# Patient Record
Sex: Female | Born: 1965 | Race: White | Hispanic: No | Marital: Single | Smoking: Current every day smoker
Health system: Southern US, Community
[De-identification: ages and names within clinical notes are randomized; demographics above are authoritative.]

## PROBLEM LIST (undated history)

## (undated) DIAGNOSIS — C4490 Unspecified malignant neoplasm of skin, unspecified: Secondary | ICD-10-CM

## (undated) DIAGNOSIS — O24419 Gestational diabetes mellitus in pregnancy, unspecified control: Secondary | ICD-10-CM

## (undated) DIAGNOSIS — F329 Major depressive disorder, single episode, unspecified: Secondary | ICD-10-CM

## (undated) DIAGNOSIS — F32A Depression, unspecified: Secondary | ICD-10-CM

## (undated) DIAGNOSIS — F419 Anxiety disorder, unspecified: Secondary | ICD-10-CM

## (undated) HISTORY — DX: Major depressive disorder, single episode, unspecified: F32.9

## (undated) HISTORY — DX: Gestational diabetes mellitus in pregnancy, unspecified control: O24.419

## (undated) HISTORY — DX: Depression, unspecified: F32.A

## (undated) HISTORY — DX: Unspecified malignant neoplasm of skin, unspecified: C44.90

## (undated) HISTORY — DX: Anxiety disorder, unspecified: F41.9

## (undated) HISTORY — PX: SKIN CANCER EXCISION: SHX779

## (undated) HISTORY — PX: TUBAL LIGATION: SHX77

---
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
## 1998-05-03 DIAGNOSIS — C4490 Unspecified malignant neoplasm of skin, unspecified: Secondary | ICD-10-CM

## 1998-05-03 HISTORY — DX: Unspecified malignant neoplasm of skin, unspecified: C44.90

## 2005-12-14 ENCOUNTER — Ambulatory Visit: Payer: Self-pay | Admitting: Family Medicine

## 2005-12-18 ENCOUNTER — Emergency Department: Payer: Self-pay | Admitting: Emergency Medicine

## 2005-12-27 ENCOUNTER — Observation Stay: Payer: Self-pay | Admitting: Unknown Physician Specialty

## 2006-01-01 ENCOUNTER — Emergency Department: Payer: Self-pay | Admitting: Emergency Medicine

## 2006-01-01 ENCOUNTER — Ambulatory Visit: Payer: Self-pay | Admitting: Family Medicine

## 2006-01-31 ENCOUNTER — Ambulatory Visit: Payer: Self-pay | Admitting: Family Medicine

## 2006-02-23 ENCOUNTER — Inpatient Hospital Stay: Payer: Self-pay

## 2006-03-03 ENCOUNTER — Ambulatory Visit: Payer: Self-pay | Admitting: Family Medicine

## 2006-04-02 ENCOUNTER — Ambulatory Visit: Payer: Self-pay | Admitting: Family Medicine

## 2006-05-03 ENCOUNTER — Ambulatory Visit: Payer: Self-pay | Admitting: Family Medicine

## 2012-09-07 ENCOUNTER — Ambulatory Visit: Payer: Self-pay | Admitting: Nurse Practitioner

## 2013-05-02 ENCOUNTER — Emergency Department: Payer: Self-pay | Admitting: Emergency Medicine

## 2014-11-10 IMAGING — CR DG ELBOW COMPLETE 3+V*L*
1 series · 4 of 4 positions shown · non-contrast
Comparison: None.

CLINICAL DATA: Pain for 1 month, no known injury

EXAM:
LEFT ELBOW - COMPLETE 3+ VIEW

[Series 1: x elbow lat left · 0.14mm/px · 4 of 4 slices shown]
[im 1/4]
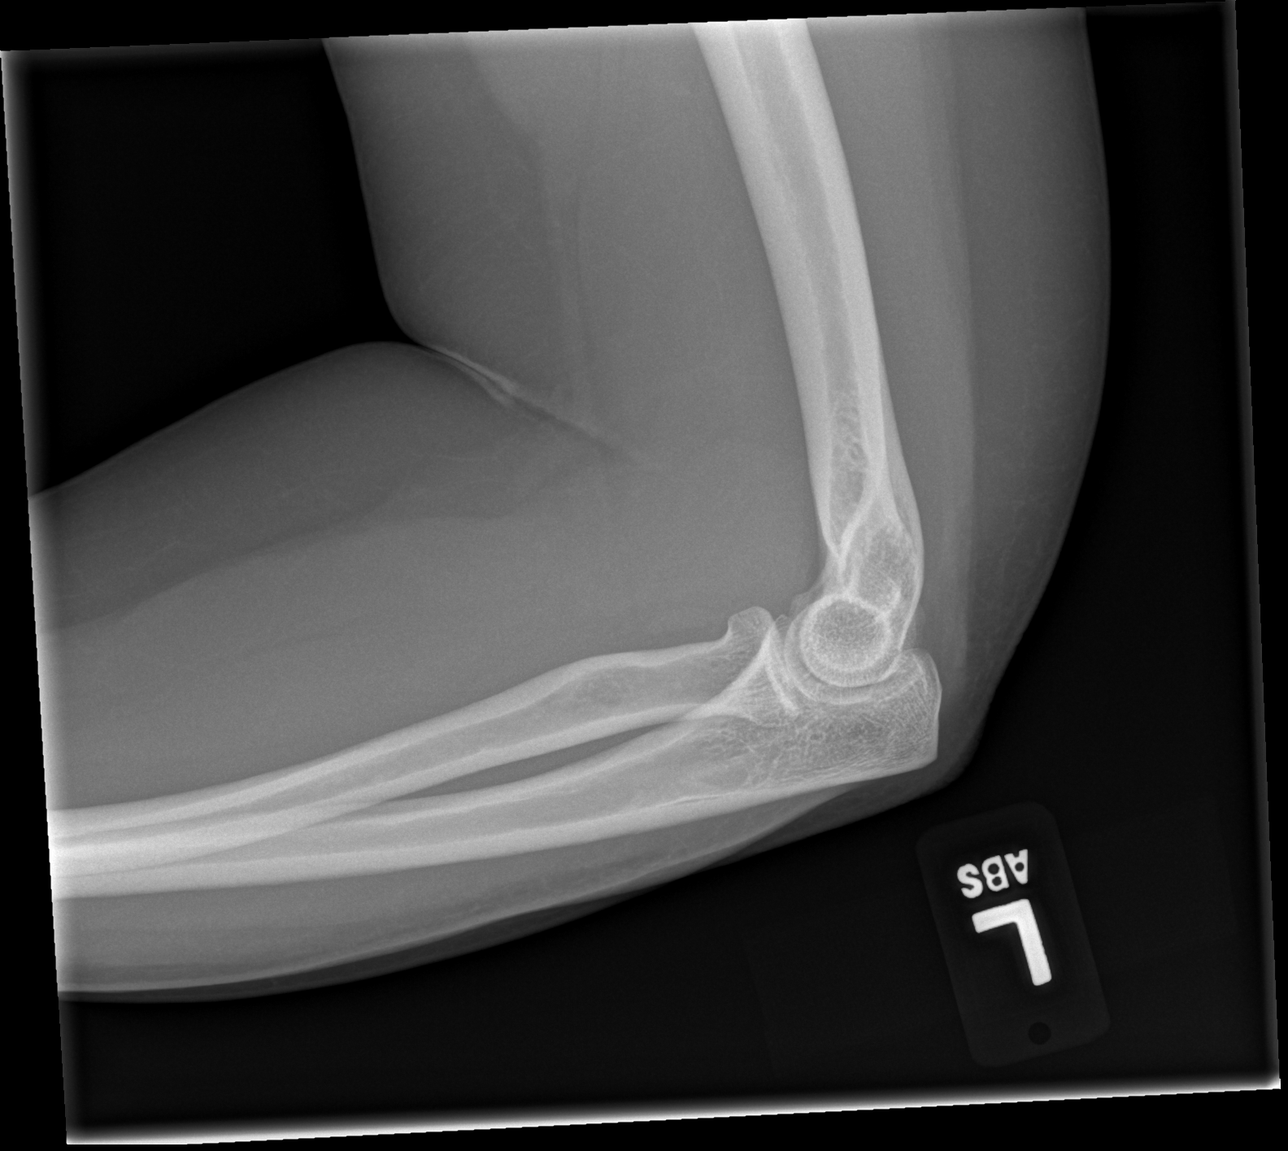
[im 2/4]
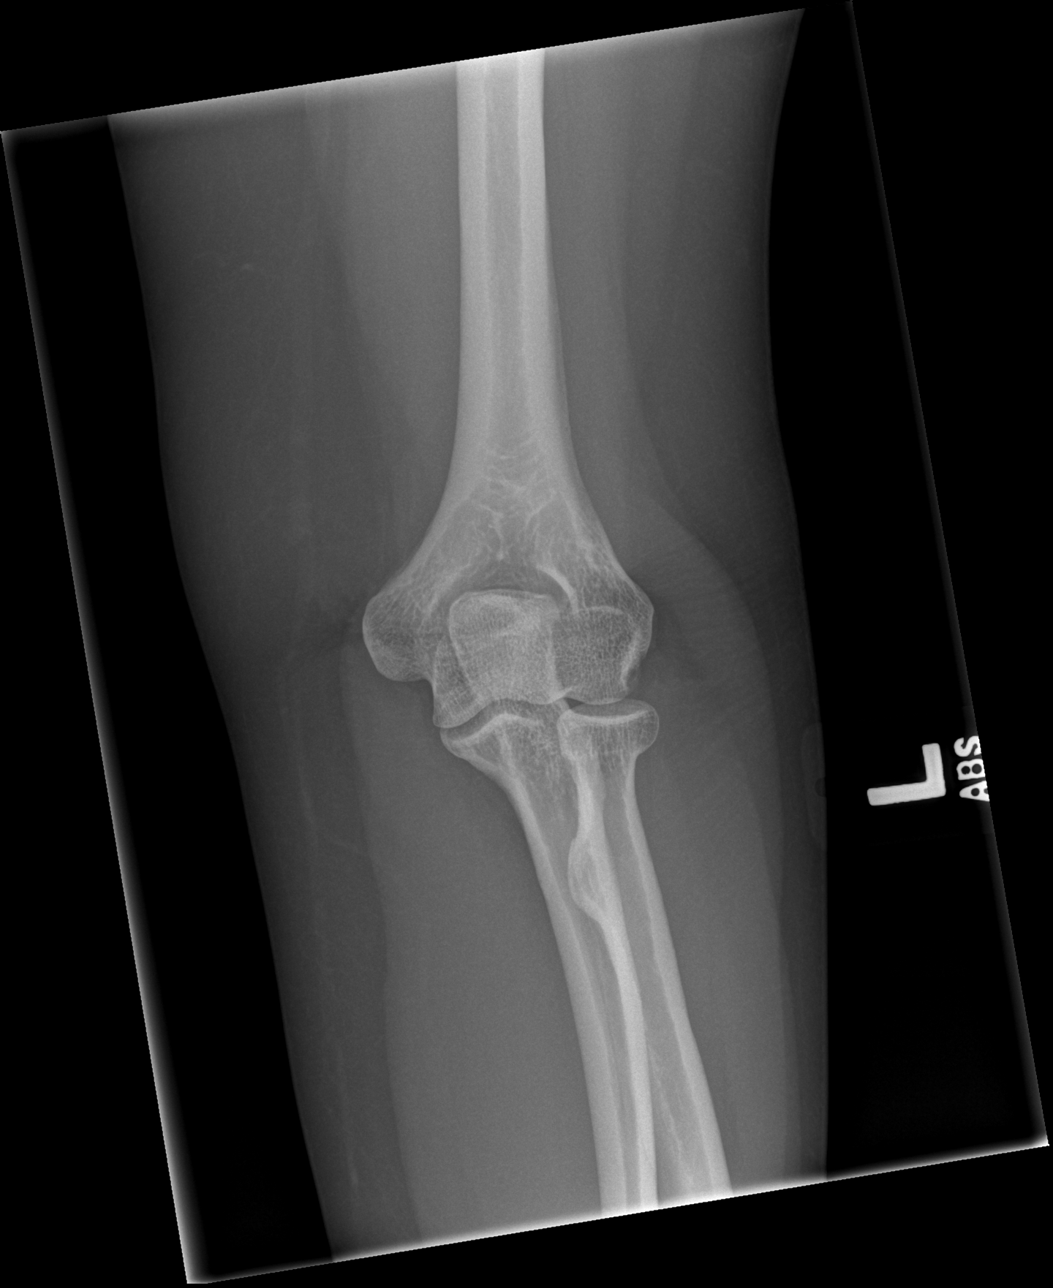
[im 3/4]
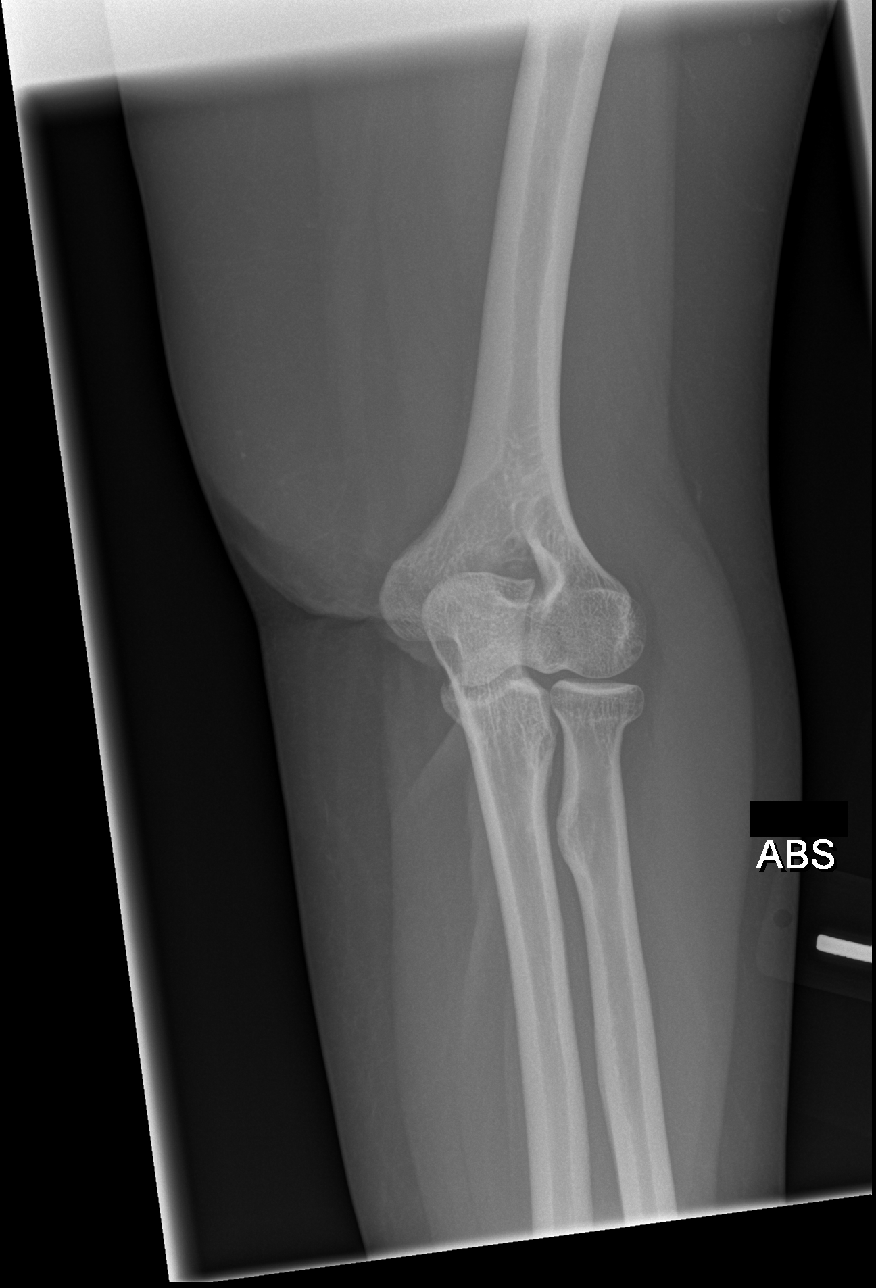
[im 4/4]
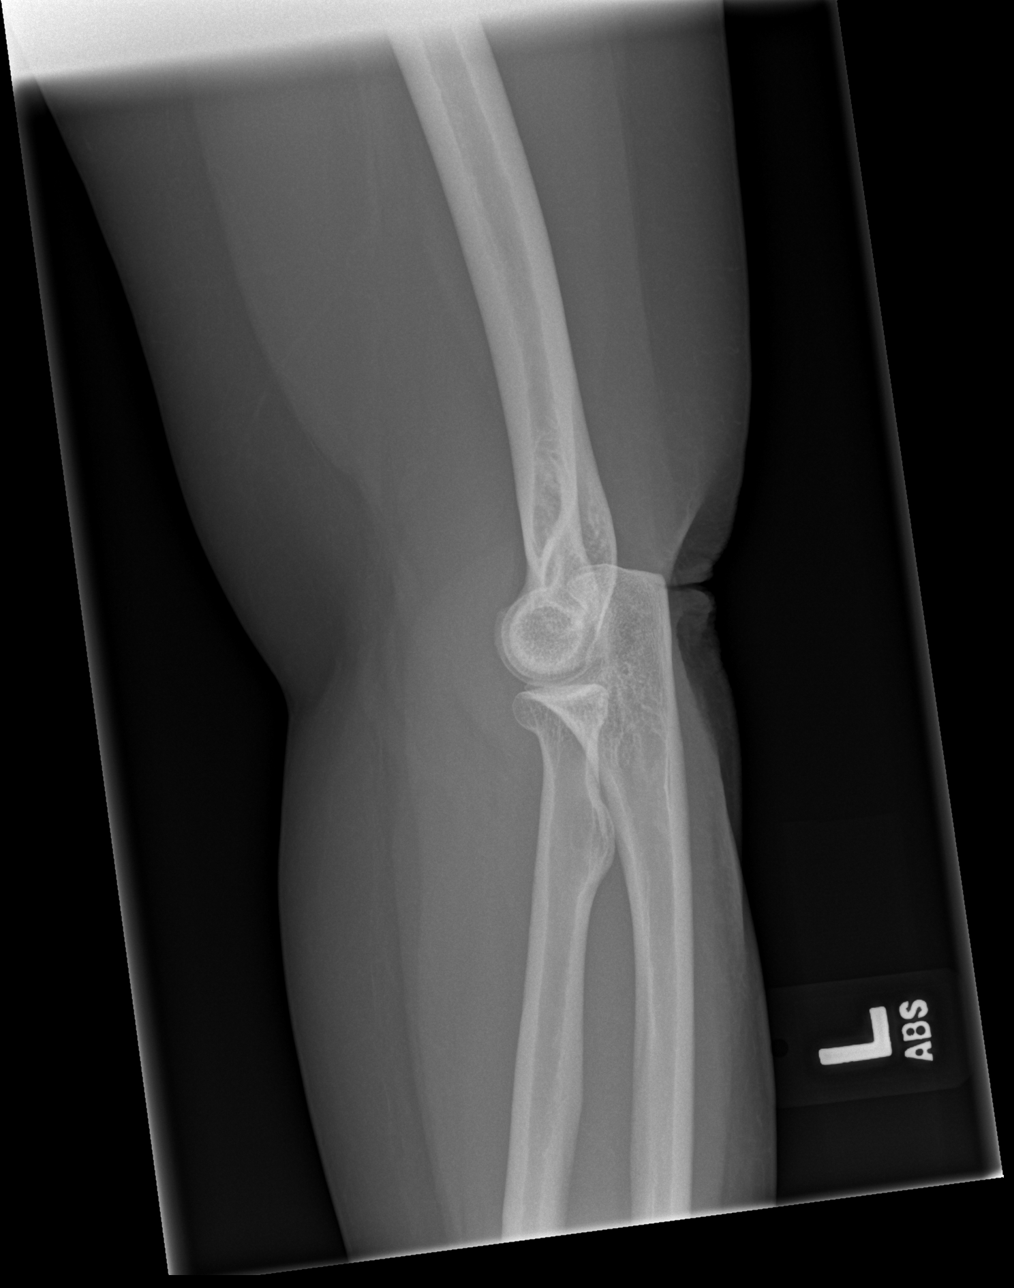

[4 of 4 positions shown; findings below may reference images not displayed]

FINDINGS: There is no evidence of fracture, dislocation, or joint effusion.
There is no evidence of arthropathy or other focal bone abnormality.
Soft tissues are unremarkable.
IMPRESSION: Negative.

## 2015-03-20 ENCOUNTER — Ambulatory Visit (INDEPENDENT_AMBULATORY_CARE_PROVIDER_SITE_OTHER): Payer: Medicaid Other | Admitting: Family Medicine

## 2015-03-20 ENCOUNTER — Encounter: Payer: Self-pay | Admitting: Family Medicine

## 2015-03-20 VITALS — BP 122/84 | HR 58 | Temp 98.4°F | Ht 65.2 in | Wt 247.0 lb

## 2015-03-20 DIAGNOSIS — Z113 Encounter for screening for infections with a predominantly sexual mode of transmission: Secondary | ICD-10-CM | POA: Diagnosis not present

## 2015-03-20 DIAGNOSIS — Z720 Tobacco use: Secondary | ICD-10-CM | POA: Diagnosis not present

## 2015-03-20 DIAGNOSIS — Z85820 Personal history of malignant melanoma of skin: Secondary | ICD-10-CM | POA: Diagnosis not present

## 2015-03-20 DIAGNOSIS — E669 Obesity, unspecified: Secondary | ICD-10-CM | POA: Insufficient documentation

## 2015-03-20 DIAGNOSIS — Z23 Encounter for immunization: Secondary | ICD-10-CM

## 2015-03-20 DIAGNOSIS — Z13220 Encounter for screening for lipoid disorders: Secondary | ICD-10-CM | POA: Diagnosis not present

## 2015-03-20 DIAGNOSIS — L989 Disorder of the skin and subcutaneous tissue, unspecified: Secondary | ICD-10-CM

## 2015-03-20 DIAGNOSIS — R238 Other skin changes: Secondary | ICD-10-CM | POA: Insufficient documentation

## 2015-03-20 LAB — UA/M W/RFLX CULTURE, ROUTINE
Bilirubin, UA: NEGATIVE
GLUCOSE, UA: NEGATIVE
Ketones, UA: NEGATIVE
LEUKOCYTES UA: NEGATIVE
Nitrite, UA: NEGATIVE
PH UA: 5 (ref 5.0–7.5)
PROTEIN UA: NEGATIVE
Specific Gravity, UA: 1.01 (ref 1.005–1.030)
UUROB: 0.2 mg/dL (ref 0.2–1.0)

## 2015-03-20 LAB — MICROSCOPIC EXAMINATION: WBC, UA: NONE SEEN /hpf (ref 0–?)

## 2015-03-20 MED ORDER — VALACYCLOVIR HCL 1 G PO TABS: 1000.0000 mg | ORAL_TABLET | Freq: Two times a day (BID) | ORAL | Status: DC

## 2015-03-20 NOTE — Assessment & Plan Note (Signed)
Will get her back into see dermatology ASAP. Referral generated today. She will call and discuss with them about getting in as she has a balance and will be scheduled ASAP.

## 2015-03-20 NOTE — Assessment & Plan Note (Signed)
With vessicles, could be a HSV outbreak, could also be irritation from chronic moisture. Will treat with valacyclovir and advised her to keep area dry and let air as much as she can. Will use vasaline on it for comfort. Will await labs and check back in in 2 weeks to make sure it's better.

## 2015-03-20 NOTE — Assessment & Plan Note (Signed)
Encouraged patient to quit smoking. Will check UA today.

## 2015-03-20 NOTE — Progress Notes (Signed)
BP 122/84 mmHg  Pulse 58  Temp(Src) 98.4 F (36.9 C)  Ht 5' 5.2" (1.656 m)  Wt 247 lb (112.038 kg)  BMI 40.85 kg/m2  SpO2 100%  LMP 03/19/2015 (Exact Date)   Subjective:    Patient ID: Courtney Buchanan, female    DOB: 05-Oct-1965, 49 y.o.   MRN: [id number]  HPI: Courtney Buchanan is a 49 y.o. female who presents today to establish care. Did a physical about 2 years ago. Can't remember where she went. Does have a history of melanoma, but has not seen a dermatologist in 2 years either because of insurance. She had her excision done in 2000, and states that everything has been good since then.   Chief Complaint  Patient presents with  . skin lesion    Patient states that she has a sore on her lower right abdomen near the vagina, she states that she is unsure if it is from wearing pads.   SKIN LESION- had a blister on her vagina for the past couple of days. It's been incredibly painful. It seems like it popped on Monday night, but it's still there. She has been wearing a pad more often as she's had some issues with incontinence and isn't sure if it's just irritation. It's not gone, has been hurting a lot Duration: started on Sunday, popped on Monday night Location: on the R side near her vagina Painful: yes Itching: no Onset: sudden Context: smaller Associated signs and symptoms: none History of skin cancer: yes History of precancerous skin lesions: yes Family history of skin cancer: no  Relevant past medical, surgical, family and social history reviewed and updated as indicated. Interim medical history since our last visit reviewed. Allergies and medications reviewed and updated.  Review of Systems  Constitutional: Negative.   Respiratory: Negative.   Cardiovascular: Negative.   Gastrointestinal: Negative.   Genitourinary: Positive for genital sores. Negative for dysuria, urgency, frequency, hematuria, flank pain, decreased urine volume, vaginal bleeding, vaginal discharge,  enuresis, difficulty urinating, vaginal pain, menstrual problem, pelvic pain and dyspareunia.  Skin: Positive for rash and wound. Negative for color change and pallor.  Psychiatric/Behavioral: Negative.     Per HPI unless specifically indicated above     Objective:    BP 122/84 mmHg  Pulse 58  Temp(Src) 98.4 F (36.9 C)  Ht 5' 5.2" (1.656 m)  Wt 247 lb (112.038 kg)  BMI 40.85 kg/m2  SpO2 100%  LMP 03/19/2015 (Exact Date)  Wt Readings from Last 3 Encounters:  03/20/15 247 lb (112.038 kg)    Physical Exam  Constitutional: She is oriented to person, place, and time. She appears well-developed and well-nourished. No distress.  HENT:  Head: Normocephalic and atraumatic.  Right Ear: Hearing normal.  Left Ear: Hearing normal.  Nose: Nose normal.  Eyes: Conjunctivae and lids are normal. Right eye exhibits no discharge. Left eye exhibits no discharge. No scleral icterus.  Cardiovascular: Normal rate, regular rhythm, normal heart sounds and intact distal pulses.  Exam reveals no gallop and no friction rub.   No murmur heard. Pulmonary/Chest: Effort normal and breath sounds normal. No respiratory distress. She has no wheezes. She has no rales.  Abdominal: Soft. Bowel sounds are normal. She exhibits no distension and no mass. There is no tenderness. There is no rebound and no guarding.  Genitourinary:    No labial fusion. There is no rash, tenderness, lesion or injury on the right labia. There is no rash, tenderness, lesion or injury on the left labia. No  erythema, tenderness or bleeding in the vagina. No foreign body around the vagina. No signs of injury around the vagina. No vaginal discharge found.  1 inch erythematous base with vessicles on R mons pubis about 2inches above labia  Musculoskeletal: Normal range of motion.  Neurological: She is alert and oriented to person, place, and time.  Skin: Skin is warm, dry and intact. No rash noted. No erythema. No pallor.  Psychiatric: She  has a normal mood and affect. Her speech is normal and behavior is normal. Judgment and thought content normal. Cognition and memory are normal.  Nursing note and vitals reviewed.  Care Everywhere reviewed, but not much in it.  Will obtain records from [facility] where she was seen 2 years ago.   Results for orders placed or performed in visit on 03/20/15  Microscopic Examination  Result Value Ref Range   WBC, UA None seen 0 -  5 /hpf   RBC, UA 0-2 0 -  2 /hpf   Epithelial Cells (non renal) 0-10 0 - 10 /hpf   Bacteria, UA Few None seen/Few  UA/M w/rflx Culture, Routine  Result Value Ref Range   Specific Gravity, UA 1.010 1.005 - 1.030   pH, UA 5.0 5.0 - 7.5   Color, UA Yellow Yellow   Appearance Ur Clear Clear   Leukocytes, UA Negative Negative   Protein, UA Negative Negative/Trace   Glucose, UA Negative Negative   Ketones, UA Negative Negative   RBC, UA 1+ (A) Negative   Bilirubin, UA Negative Negative   Urobilinogen, Ur 0.2 0.2 - 1.0 mg/dL   Nitrite, UA Negative Negative   Microscopic Examination See below:       Assessment & Plan:   Problem List Items Addressed This Visit      Musculoskeletal and Integument   Skin irritation - Primary    With vessicles, could be a HSV outbreak, could also be irritation from chronic moisture. Will treat with valacyclovir and advised her to keep area dry and let air as much as she can. Will use vasaline on it for comfort. Will await labs and check back in in 2 weeks to make sure it's better.         Other   History of melanoma    Will get her back into see dermatology ASAP. Referral generated today. She will call and discuss with them about getting in as she has a balance and will be scheduled ASAP.       Relevant Orders   Ambulatory referral to Dermatology   CBC with Differential/Platelet   Comprehensive metabolic panel   Tobacco abuse    Encouraged patient to quit smoking. Will check UA today.      Relevant Orders    UA/M w/rflx Culture, Routine (Completed)   Obesity   Relevant Orders   TSH    Other Visit Diagnoses    Immunization due        Tdap due and given today.    Relevant Orders    Tdap vaccine greater than or equal to 7yo IM (Completed)    Routine screening for STI (sexually transmitted infection)        Labs drawn today. Not concerned, but states it's better to be safe than sorry.    Relevant Orders    HIV antibody    Hepatitis, Acute    GC/Chlamydia Probe Amp    RPR    HSV(herpes simplex vrs) 1+2 ab-IgG    Screening for cholesterol level  Labs drawn today. Await results.     Relevant Orders    Lipid Panel w/o Chol/HDL Ratio        Follow up plan: Return 2-3 weeks, for PE and follow up skin issues, records request from [place].

## 2015-03-21 ENCOUNTER — Telehealth: Payer: Self-pay | Admitting: Family Medicine

## 2015-03-21 DIAGNOSIS — E785 Hyperlipidemia, unspecified: Secondary | ICD-10-CM | POA: Insufficient documentation

## 2015-03-21 LAB — CBC WITH DIFFERENTIAL/PLATELET
BASOS: 1 %
Basophils Absolute: 0 10*3/uL (ref 0.0–0.2)
EOS (ABSOLUTE): 0.2 10*3/uL (ref 0.0–0.4)
Eos: 3 %
HEMOGLOBIN: 12.2 g/dL (ref 11.1–15.9)
Hematocrit: 37.6 % (ref 34.0–46.6)
IMMATURE GRANS (ABS): 0 10*3/uL (ref 0.0–0.1)
Immature Granulocytes: 0 %
LYMPHS ABS: 2.2 10*3/uL (ref 0.7–3.1)
Lymphs: 34 %
MCH: 29.8 pg (ref 26.6–33.0)
MCHC: 32.4 g/dL (ref 31.5–35.7)
MCV: 92 fL (ref 79–97)
MONOCYTES: 7 %
Monocytes Absolute: 0.4 10*3/uL (ref 0.1–0.9)
NEUTROS ABS: 3.6 10*3/uL (ref 1.4–7.0)
Neutrophils: 55 %
Platelets: 284 10*3/uL (ref 150–379)
RBC: 4.1 x10E6/uL (ref 3.77–5.28)
RDW: 15.4 % (ref 12.3–15.4)
WBC: 6.5 10*3/uL (ref 3.4–10.8)

## 2015-03-21 LAB — TSH: TSH: 0.909 u[IU]/mL (ref 0.450–4.500)

## 2015-03-21 LAB — COMPREHENSIVE METABOLIC PANEL
ALK PHOS: 49 IU/L (ref 39–117)
ALT: 10 IU/L (ref 0–32)
AST: 13 IU/L (ref 0–40)
Albumin/Globulin Ratio: 1.6 (ref 1.1–2.5)
Albumin: 4 g/dL (ref 3.5–5.5)
BUN/Creatinine Ratio: 15 (ref 9–23)
BUN: 11 mg/dL (ref 6–24)
Bilirubin Total: <0.2 mg/dL (ref 0.0–1.2)
CO2: 23 mmol/L (ref 18–29)
CREATININE: 0.72 mg/dL (ref 0.57–1.00)
Calcium: 9 mg/dL (ref 8.7–10.2)
Chloride: 102 mmol/L (ref 97–106)
GFR calc Af Amer: 115 mL/min/{1.73_m2} (ref >59)
GFR calc non Af Amer: 100 mL/min/{1.73_m2} (ref >59)
GLUCOSE: 89 mg/dL (ref 65–99)
Globulin, Total: 2.5 g/dL (ref 1.5–4.5)
Potassium: 4.9 mmol/L (ref 3.5–5.2)
Sodium: 140 mmol/L (ref 136–144)
Total Protein: 6.5 g/dL (ref 6.0–8.5)

## 2015-03-21 LAB — HEPATITIS PANEL, ACUTE
HEP A IGM: NEGATIVE
HEP B C IGM: NEGATIVE
HEP B S AG: NEGATIVE

## 2015-03-21 LAB — LIPID PANEL W/O CHOL/HDL RATIO
Cholesterol, Total: 202 mg/dL — ABNORMAL HIGH (ref 100–199)
HDL: 51 mg/dL (ref >39)
LDL CALC: 136 mg/dL — AB (ref 0–99)
Triglycerides: 76 mg/dL (ref 0–149)
VLDL CHOLESTEROL CAL: 15 mg/dL (ref 5–40)

## 2015-03-21 LAB — HIV ANTIBODY (ROUTINE TESTING W REFLEX): HIV Screen 4th Generation wRfx: NONREACTIVE

## 2015-03-21 LAB — HSV(HERPES SIMPLEX VRS) I + II AB-IGG: HSV 2 GLYCOPROTEIN G AB, IGG: 8.26 {index} — AB (ref 0.00–0.90)

## 2015-03-21 LAB — RPR: RPR: NONREACTIVE

## 2015-03-21 NOTE — Telephone Encounter (Signed)
Called and gave patient the results. 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 Continue valtrex and monitor. Recheck at scheduled appointment in 2 weeks.

## 2015-03-22 LAB — GC/CHLAMYDIA PROBE AMP
CHLAMYDIA, DNA PROBE: NEGATIVE
Neisseria gonorrhoeae by PCR: NEGATIVE

## 2015-03-24 ENCOUNTER — Encounter: Payer: Self-pay | Admitting: Family Medicine

## 2015-04-10 ENCOUNTER — Encounter: Payer: Self-pay | Admitting: Family Medicine

## 2015-04-10 ENCOUNTER — Ambulatory Visit (INDEPENDENT_AMBULATORY_CARE_PROVIDER_SITE_OTHER): Payer: Medicaid Other | Admitting: Family Medicine

## 2015-04-10 ENCOUNTER — Encounter: Payer: Self-pay | Admitting: *Deleted

## 2015-04-10 ENCOUNTER — Telehealth: Payer: Self-pay

## 2015-04-10 VITALS — BP 130/80 | HR 74 | Temp 98.5°F | Ht 65.0 in | Wt 246.0 lb

## 2015-04-10 DIAGNOSIS — F332 Major depressive disorder, recurrent severe without psychotic features: Secondary | ICD-10-CM

## 2015-04-10 DIAGNOSIS — B009 Herpesviral infection, unspecified: Secondary | ICD-10-CM | POA: Insufficient documentation

## 2015-04-10 DIAGNOSIS — Z1239 Encounter for other screening for malignant neoplasm of breast: Secondary | ICD-10-CM

## 2015-04-10 DIAGNOSIS — Z85820 Personal history of malignant melanoma of skin: Secondary | ICD-10-CM

## 2015-04-10 DIAGNOSIS — Z0000 Encounter for general adult medical examination without abnormal findings: Secondary | ICD-10-CM

## 2015-04-10 DIAGNOSIS — Z1159 Encounter for screening for other viral diseases: Secondary | ICD-10-CM

## 2015-04-10 DIAGNOSIS — K649 Unspecified hemorrhoids: Secondary | ICD-10-CM | POA: Insufficient documentation

## 2015-04-10 DIAGNOSIS — N393 Stress incontinence (female) (male): Secondary | ICD-10-CM | POA: Insufficient documentation

## 2015-04-10 MED ORDER — FLUOXETINE HCL 20 MG PO TABS: ORAL_TABLET | ORAL | Status: DC

## 2015-04-10 MED ORDER — FLUOXETINE HCL 20 MG PO CAPS: 20.0000 mg | ORAL_CAPSULE | Freq: Every day | ORAL | Status: DC

## 2015-04-10 NOTE — Telephone Encounter (Signed)
Rx sent to her pharmacy 

## 2015-04-10 NOTE — Telephone Encounter (Signed)
Please send a new script for BRAND name fluoxetine 20mg  [facility] [place]

## 2015-04-10 NOTE — Assessment & Plan Note (Signed)
To see dermatology ASAP. Just needs to pay off a bill.

## 2015-04-10 NOTE — Patient Instructions (Signed)
Health Maintenance, Female Adopting a healthy lifestyle and getting preventive care can go a long way to promote health and wellness. Talk with your health care provider about what schedule of regular examinations is right for you. This is a good chance for you to check in with your provider about disease prevention and staying healthy. In between checkups, there are plenty of things you can do on your own. Experts have done a lot of research about which lifestyle changes and preventive measures are most likely to keep you healthy. Ask your health care provider for more information. WEIGHT AND DIET  Eat a healthy diet  Be sure to include plenty of vegetables, fruits, low-fat dairy products, and lean protein.  Do not eat a lot of foods high in solid fats, added sugars, or salt.  Get regular exercise. This is one of the most important things you can do for your health.  Most adults should exercise for at least 150 minutes each week. The exercise should increase your heart rate and make you sweat (moderate-intensity exercise).  Most adults should also do strengthening exercises at least twice a week. This is in addition to the moderate-intensity exercise.  Maintain a healthy weight  Body mass index (BMI) is a measurement that can be used to identify possible weight problems. It estimates body fat based on height and weight. Your health care provider can help determine your BMI and help you achieve or maintain a healthy weight.  For females 20 years of age and older:   A BMI below 18.5 is considered underweight.  A BMI of 18.5 to 24.9 is normal.  A BMI of 25 to 29.9 is considered overweight.  A BMI of 30 and above is considered obese.  Watch levels of cholesterol and blood lipids  You should start having your blood tested for lipids and cholesterol at 47 years of age, then have this test every 5 years.  You may need to have your cholesterol levels checked more often if:  Your lipid  or cholesterol levels are high.  You are older than 47 years of age.  You are at high risk for heart disease.  CANCER SCREENING   Lung Cancer  Lung cancer screening is recommended for adults 55-80 years old who are at high risk for lung cancer because of a history of smoking.  A yearly low-dose CT scan of the lungs is recommended for people who:  Currently smoke.  Have quit within the past 15 years.  Have at least a 30-pack-year history of smoking. A pack year is smoking an average of one pack of cigarettes a day for 1 year.  Yearly screening should continue until it has been 15 years since you quit.  Yearly screening should stop if you develop a health problem that would prevent you from having lung cancer treatment.  Breast Cancer  Practice breast self-awareness. This means understanding how your breasts normally appear and feel.  It also means doing regular breast self-exams. Let your health care provider know about any changes, no matter how small.  If you are in your 20s or 30s, you should have a clinical breast exam (CBE) by a health care provider every 1-3 years as part of a regular health exam.  If you are 40 or older, have a CBE every year. Also consider having a breast X-ray (mammogram) every year.  If you have a family history of breast cancer, talk to your health care provider about genetic screening.  If you   are at high risk for breast cancer, talk to your health care provider about having an MRI and a mammogram every year.  Breast cancer gene (BRCA) assessment is recommended for women who have family members with BRCA-related cancers. BRCA-related cancers include:  Breast.  Ovarian.  Tubal.  Peritoneal cancers.  Results of the assessment will determine the need for genetic counseling and BRCA1 and BRCA2 testing. Cervical Cancer Your health care provider may recommend that you be screened regularly for cancer of the pelvic organs (ovaries, uterus, and  vagina). This screening involves a pelvic examination, including checking for microscopic changes to the surface of your cervix (Pap test). You may be encouraged to have this screening done every 3 years, beginning at age 21.  For women ages 30-65, health care providers may recommend pelvic exams and Pap testing every 3 years, or they may recommend the Pap and pelvic exam, combined with testing for human papilloma virus (HPV), every 5 years. Some types of HPV increase your risk of cervical cancer. Testing for HPV may also be done on women of any age with unclear Pap test results.  Other health care providers may not recommend any screening for nonpregnant women who are considered low risk for pelvic cancer and who do not have symptoms. Ask your health care provider if a screening pelvic exam is right for you.  If you have had past treatment for cervical cancer or a condition that could lead to cancer, you need Pap tests and screening for cancer for at least 20 years after your treatment. If Pap tests have been discontinued, your risk factors (such as having a new sexual partner) need to be reassessed to determine if screening should resume. Some women have medical problems that increase the chance of getting cervical cancer. In these cases, your health care provider may recommend more frequent screening and Pap tests. Colorectal Cancer  This type of cancer can be detected and often prevented.  Routine colorectal cancer screening usually begins at 47 years of age and continues through 47 years of age.  Your health care provider may recommend screening at an earlier age if you have risk factors for colon cancer.  Your health care provider may also recommend using home test kits to check for hidden blood in the stool.  A small camera at the end of a tube can be used to examine your colon directly (sigmoidoscopy or colonoscopy). This is done to check for the earliest forms of colorectal  cancer.  Routine screening usually begins at age 50.  Direct examination of the colon should be repeated every 5-10 years through 47 years of age. However, you may need to be screened more often if early forms of precancerous polyps or small growths are found. Skin Cancer  Check your skin from head to toe regularly.  Tell your health care provider about any new moles or changes in moles, especially if there is a change in a mole's shape or color.  Also tell your health care provider if you have a mole that is larger than the size of a pencil eraser.  Always use sunscreen. Apply sunscreen liberally and repeatedly throughout the day.  Protect yourself by wearing long sleeves, pants, a wide-brimmed hat, and sunglasses whenever you are outside. HEART DISEASE, DIABETES, AND HIGH BLOOD PRESSURE   High blood pressure causes heart disease and increases the risk of stroke. High blood pressure is more likely to develop in:  People who have blood pressure in the high end   of the normal range (130-139/85-89 mm Hg).  People who are overweight or obese.  People who are African American.  If you are 38-23 years of age, have your blood pressure checked every 3-5 years. If you are 61 years of age or older, have your blood pressure checked every year. You should have your blood pressure measured twice--once when you are at a hospital or clinic, and once when you are not at a hospital or clinic. Record the average of the two measurements. To check your blood pressure when you are not at a hospital or clinic, you can use:  An automated blood pressure machine at a pharmacy.  A home blood pressure monitor.  If you are between 45 years and 39 years old, ask your health care provider if you should take aspirin to prevent strokes.  Have regular diabetes screenings. This involves taking a blood sample to check your fasting blood sugar level.  If you are at a normal weight and have a low risk for diabetes,  have this test once every three years after 47 years of age.  If you are overweight and have a high risk for diabetes, consider being tested at a younger age or more often. PREVENTING INFECTION  Hepatitis B  If you have a higher risk for hepatitis B, you should be screened for this virus. You are considered at high risk for hepatitis B if:  You were born in a country where hepatitis B is common. Ask your health care provider which countries are considered high risk.  Your parents were born in a high-risk country, and you have not been immunized against hepatitis B (hepatitis B vaccine).  You have HIV or AIDS.  You use needles to inject street drugs.  You live with someone who has hepatitis B.  You have had sex with someone who has hepatitis B.  You get hemodialysis treatment.  You take certain medicines for conditions, including cancer, organ transplantation, and autoimmune conditions. Hepatitis C  Blood testing is recommended for:  Everyone born from [birth year] through 1963.  Anyone with known risk factors for hepatitis C. Sexually transmitted infections (STIs)  You should be screened for sexually transmitted infections (STIs) including gonorrhea and chlamydia if:  You are sexually active and are younger than 47 years of age.  You are older than 47 years of age and your health care provider tells you that you are at risk for this type of infection.  Your sexual activity has changed since you were last screened and you are at an increased risk for chlamydia or gonorrhea. Ask your health care provider if you are at risk.  If you do not have HIV, but are at risk, it may be recommended that you take a prescription medicine daily to prevent HIV infection. This is called pre-exposure prophylaxis (PrEP). You are considered at risk if:  You are sexually active and do not regularly use condoms or know the HIV status of your partner(s).  You take drugs by injection.  You are sexually  active with a partner who has HIV. Talk with your health care provider about whether you are at high risk of being infected with HIV. If you choose to begin PrEP, you should first be tested for HIV. You should then be tested every 3 months for as long as you are taking PrEP.  PREGNANCY   If you are premenopausal and you may become pregnant, ask your health care provider about preconception counseling.  If you may  become pregnant, take 400 to 800 micrograms (mcg) of folic acid every day.  If you want to prevent pregnancy, talk to your health care provider about birth control (contraception). OSTEOPOROSIS AND MENOPAUSE   Osteoporosis is a disease in which the bones lose minerals and strength with aging. This can result in serious bone fractures. Your risk for osteoporosis can be identified using a bone density scan.  If you are 61 years of age or older, or if you are at risk for osteoporosis and fractures, ask your health care provider if you should be screened.  Ask your health care provider whether you should take a calcium or vitamin D supplement to lower your risk for osteoporosis.  Menopause may have certain physical symptoms and risks.  Hormone replacement therapy may reduce some of these symptoms and risks. Talk to your health care provider about whether hormone replacement therapy is right for you.  HOME CARE INSTRUCTIONS   Schedule regular health, dental, and eye exams.  Stay current with your immunizations.   Do not use any tobacco products including cigarettes, chewing tobacco, or electronic cigarettes.  If you are pregnant, do not drink alcohol.  If you are breastfeeding, limit how much and how often you drink alcohol.  Limit alcohol intake to no more than 1 drink per day for nonpregnant women. One drink equals 12 ounces of beer, 5 ounces of wine, or 1 ounces of hard liquor.  Do not use street drugs.  Do not share needles.  Ask your health care provider for help if  you need support or information about quitting drugs.  Tell your health care provider if you often feel depressed.  Tell your health care provider if you have ever been abused or do not feel safe at home.   This information is not intended to replace advice given to you by your health care provider. Make sure you discuss any questions you have with your health care provider.   Document Released: 11/02/2010 Document Revised: 05/10/2014 Document Reviewed: 03/21/2013 Elsevier Interactive Patient Education [employer].

## 2015-04-10 NOTE — Progress Notes (Signed)
BP 130/80 mmHg  Pulse 74  Temp(Src) 98.5 F (36.9 C)  Ht 5\' 5"  (1.651 m)  Wt 246 lb (111.585 kg)  BMI 40.94 kg/m2  SpO2 100%  LMP 03/19/2015 (Exact Date)   Subjective:    Patient ID: Courtney Buchanan, female    DOB: 02/28/1966, 49 y.o.   MRN: [id number]  HPI: Courtney Buchanan is a 49 y.o. female presenting on 04/10/2015 for comprehensive medical examination. Current medical complaints include:  DEPRESSION Mood status: uncontrolled Satisfied with current treatment?: no- was on prozac years ago for about 3-4 months, worked well for her.  Symptom severity: severe  Duration of current treatment: Has been off everything for about 15 years Side effects: no Psychotherapy/counseling: no  Previous psychiatric medications: prozac Depressed mood: yes Anxious mood: yes Anhedonia: no Significant weight loss or gain: no Insomnia: yes hard to stay asleep Fatigue: yes Feelings of worthlessness or guilt: yes Impaired concentration/indecisiveness: yes Suicidal ideations: yes- passive only Hopelessness: yes Crying spells: yes Depression screen PHQ 2/9 04/10/2015  Decreased Interest 1  Down, Depressed, Hopeless 2  PHQ - 2 Score 3  Altered sleeping 3  Tired, decreased energy 2  Change in appetite 1  Feeling bad or failure about yourself  3  Trouble concentrating 0  Moving slowly or fidgety/restless 1  Suicidal thoughts 2  PHQ-9 Score 15  Difficult doing work/chores Very difficult   She currently lives with: her children Menopausal Symptoms: yes  The patient does not have a history of falls. I did not complete a risk assessment for falls. A plan of care for falls was not documented.   Past Medical History:  Past Medical History  Diagnosis Date  . Skin cancer 2000    melanoma    Surgical History:  Past Surgical History  Procedure Laterality Date  . Skin cancer excision    . Knee surgery      Right  . Tubal ligation      Medications:  No current outpatient prescriptions  on file prior to visit.   No current facility-administered medications on file prior to visit.    Allergies:  Allergies  Allergen Reactions  . Penicillins Swelling    Hives and throat constricts    Social History:  Social History   Social History  . Marital Status: Single    Spouse Name: N/A  . Number of Children: N/A  . Years of Education: N/A   Occupational History  . Not on file.   Social History Main Topics  . Smoking status: Current Every Day Smoker -- 0.50 packs/day    Types: Cigarettes  . Smokeless tobacco: Never Used  . Alcohol Use: Yes     Comment: Rare  . Drug Use: Yes    Special: Marijuana  . Sexual Activity: No   Other Topics Concern  . Not on file   Social History Narrative   History  Smoking status  . Current Every Day Smoker -- 0.50 packs/day  . Types: Cigarettes  Smokeless tobacco  . Never Used   History  Alcohol Use  . Yes    Comment: Rare    Family History:  Family History  Problem Relation Age of Onset  . Diabetes Mother   . Aneurysm Father     Brain  . ADD / ADHD Son   . Heart Problems Son   . Autism Son   . Diabetes Maternal Aunt   . Diabetes Maternal Grandmother   . ADD / ADHD Son     Past  medical history, surgical history, medications, allergies, family history and social history reviewed with patient today and changes made to appropriate areas of the chart.   Review of Systems  Constitutional: Positive for diaphoresis. Negative for fever, chills, weight loss and malaise/fatigue.  HENT: Positive for congestion. Negative for ear discharge, ear pain, hearing loss, nosebleeds, sore throat and tinnitus.   Eyes: Negative.   Respiratory: Positive for cough. Negative for hemoptysis, sputum production, shortness of breath, wheezing and stridor.   Cardiovascular: Negative.   Gastrointestinal: Positive for heartburn, nausea, diarrhea, constipation and blood in stool. Negative for vomiting, abdominal pain and melena.    Genitourinary: Positive for urgency and frequency. Negative for dysuria, hematuria and flank pain.       Leakage- has been going on for many years, acting up almost every day for the last year, has done poise, has done kegals, stress incontience  Musculoskeletal: Negative.   Skin: Negative.   Neurological: Positive for tingling and sensory change. Negative for dizziness, tremors, speech change, focal weakness, seizures, loss of consciousness, weakness and headaches.  Endo/Heme/Allergies: Negative.   Psychiatric/Behavioral: Positive for depression. Negative for suicidal ideas, hallucinations, memory loss and substance abuse. The patient is nervous/anxious. The patient does not have insomnia.     All other ROS negative except what is listed above and in the HPI.      Objective:    BP 130/80 mmHg  Pulse 74  Temp(Src) 98.5 F (36.9 C)  Ht 5\' 5"  (1.651 m)  Wt 246 lb (111.585 kg)  BMI 40.94 kg/m2  SpO2 100%  LMP 03/19/2015 (Exact Date)  Wt Readings from Last 3 Encounters:  04/10/15 246 lb (111.585 kg)  03/20/15 247 lb (112.038 kg)    Physical Exam  Constitutional: She is oriented to person, place, and time. She appears well-developed and well-nourished. No distress.  HENT:  Head: Normocephalic and atraumatic.  Right Ear: Hearing, tympanic membrane, external ear and ear canal normal.  Left Ear: Hearing, tympanic membrane, external ear and ear canal normal.  Nose: Nose normal.  Mouth/Throat: Uvula is midline, oropharynx is clear and moist and mucous membranes are normal. No oropharyngeal exudate.  Eyes: Conjunctivae, EOM and lids are normal. Pupils are equal, round, and reactive to light. Right eye exhibits no discharge. Left eye exhibits no discharge. No scleral icterus.  Neck: Normal range of motion. Neck supple. No JVD present. No tracheal deviation present. No thyromegaly present.  Cardiovascular: Normal rate, regular rhythm, normal heart sounds and intact distal pulses.  Exam  reveals no gallop and no friction rub.   No murmur heard. Pulmonary/Chest: Effort normal and breath sounds normal. No stridor. No respiratory distress. She has no wheezes. She has no rales. She exhibits no tenderness. Right breast exhibits no inverted nipple, no mass, no nipple discharge, no skin change and no tenderness. Left breast exhibits no inverted nipple, no mass, no nipple discharge, no skin change and no tenderness. Breasts are symmetrical.  Abdominal: Soft. Bowel sounds are normal. She exhibits no distension and no mass. There is no tenderness. There is no rebound and no guarding.  Genitourinary:    No breast swelling, tenderness, discharge or bleeding.  Large, non-thrombosed hemorrhoids at 6 o'clock on her anus  Musculoskeletal: Normal range of motion.  Lymphadenopathy:    She has no cervical adenopathy.  Neurological: She is alert and oriented to person, place, and time. She has normal reflexes. She displays normal reflexes. No cranial nerve deficit. She exhibits normal muscle tone. Coordination normal.  Skin:  Skin is warm, dry and intact. No rash noted. She is not diaphoretic. No erythema. No pallor.  Psychiatric: Her speech is normal and behavior is normal. Judgment and thought content normal. Cognition and memory are normal. She exhibits a depressed mood.  Tearful at times  Nursing note and vitals reviewed.   Results for orders placed or performed in visit on 03/20/15  GC/Chlamydia Probe Amp  Result Value Ref Range   Chlamydia trachomatis, NAA Negative Negative   Neisseria gonorrhoeae by PCR Negative Negative  Microscopic Examination  Result Value Ref Range   WBC, UA None seen 0 -  5 /hpf   RBC, UA 0-2 0 -  2 /hpf   Epithelial Cells (non renal) 0-10 0 - 10 /hpf   Bacteria, UA Few None seen/Few  HIV antibody  Result Value Ref Range   HIV Screen 4th Generation wRfx Non Reactive Non Reactive  CBC with Differential/Platelet  Result Value Ref Range   WBC 6.5 3.4 - 10.8  x10E3/uL   RBC 4.10 3.77 - 5.28 x10E6/uL   Hemoglobin 12.2 11.1 - 15.9 g/dL   Hematocrit 37.6 34.0 - 46.6 %   MCV 92 79 - 97 fL   MCH 29.8 26.6 - 33.0 pg   MCHC 32.4 31.5 - 35.7 g/dL   RDW 15.4 12.3 - 15.4 %   Platelets 284 150 - 379 x10E3/uL   Neutrophils 55 %   Lymphs 34 %   Monocytes 7 %   Eos 3 %   Basos 1 %   Neutrophils Absolute 3.6 1.4 - 7.0 x10E3/uL   Lymphocytes Absolute 2.2 0.7 - 3.1 x10E3/uL   Monocytes Absolute 0.4 0.1 - 0.9 x10E3/uL   EOS (ABSOLUTE) 0.2 0.0 - 0.4 x10E3/uL   Basophils Absolute 0.0 0.0 - 0.2 x10E3/uL   Immature Granulocytes 0 %   Immature Grans (Abs) 0.0 0.0 - 0.1 x10E3/uL  Lipid Panel w/o Chol/HDL Ratio  Result Value Ref Range   Cholesterol, Total 202 (H) 100 - 199 mg/dL   Triglycerides 76 0 - 149 mg/dL   HDL 51 >39 mg/dL   VLDL Cholesterol Cal 15 5 - 40 mg/dL   LDL Calculated 136 (H) 0 - 99 mg/dL  TSH  Result Value Ref Range   TSH 0.909 0.450 - 4.500 uIU/mL  UA/M w/rflx Culture, Routine  Result Value Ref Range   Specific Gravity, UA 1.010 1.005 - 1.030   pH, UA 5.0 5.0 - 7.5   Color, UA Yellow Yellow   Appearance Ur Clear Clear   Leukocytes, UA Negative Negative   Protein, UA Negative Negative/Trace   Glucose, UA Negative Negative   Ketones, UA Negative Negative   RBC, UA 1+ (A) Negative   Bilirubin, UA Negative Negative   Urobilinogen, Ur 0.2 0.2 - 1.0 mg/dL   Nitrite, UA Negative Negative   Microscopic Examination See below:   Comprehensive metabolic panel  Result Value Ref Range   Glucose 89 65 - 99 mg/dL   BUN 11 6 - 24 mg/dL   Creatinine, Ser 0.72 0.57 - 1.00 mg/dL   GFR calc non Af Amer 100 >59 mL/min/1.73   GFR calc Af Amer 115 >59 mL/min/1.73   BUN/Creatinine Ratio 15 9 - 23   Sodium 140 136 - 144 mmol/L   Potassium 4.9 3.5 - 5.2 mmol/L   Chloride 102 97 - 106 mmol/L   CO2 23 18 - 29 mmol/L   Calcium 9.0 8.7 - 10.2 mg/dL   Total Protein 6.5 6.0 - 8.5 g/dL  Albumin 4.0 3.5 - 5.5 g/dL   Globulin, Total 2.5 1.5 - 4.5  g/dL   Albumin/Globulin Ratio 1.6 1.1 - 2.5   Bilirubin Total <0.2 0.0 - 1.2 mg/dL   Alkaline Phosphatase 49 39 - 117 IU/L   AST 13 0 - 40 IU/L   ALT 10 0 - 32 IU/L  Hepatitis, Acute  Result Value Ref Range   Hep A IgM Negative Negative   Hepatitis B Surface Ag Negative Negative   Hep B C IgM Negative Negative   Hep C Virus Ab <0.1 0.0 - 0.9 s/co ratio  RPR  Result Value Ref Range   RPR Ser Ql Non Reactive Non Reactive  HSV(herpes simplex vrs) 1+2 ab-IgG  Result Value Ref Range   HSV 1 Glycoprotein G Ab, IgG <0.91 0.00 - 0.90 index   HSV 2 Glycoprotein G Ab, IgG 8.26 (H) 0.00 - 0.90 index      Assessment & Plan:   Problem List Items Addressed This Visit      Cardiovascular and Mediastinum   Hemorrhoids    Significant and irritating. Has tried anosol, has tried tucks, has had them for many years since her first child. Referral to general surgery for evaluation and possible surgery.       Relevant Orders   Ambulatory referral to General Surgery     Other   History of melanoma    To see dermatology ASAP. Just needs to pay off a bill.       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 Rx sent to her pharmacy. 1/2 tab x 1 week, then increase to whole tab. Check back in in 1 month.       Relevant Medications   FLUoxetine (PROZAC) 20 MG tablet   Stress incontinence    Severe and irritating to patient. Referral to urogyn made today. Await referral. Timed voidings to decrease volume in bladder. Continue poise pads. Continue kegals.       Relevant Orders   Ambulatory referral to Urogynecology    Other Visit Diagnoses    Routine general medical examination at a health care facility    -  Primary    Up to date on tdap. Refused flu and pneumovax. Pap up to date. Mammogram ordered. Screening labs checked last visit. Work on diet and exercise.     Breast cancer screening        Relevant Orders    MM DIGITAL SCREENING BILATERAL        Follow up  plan: Return in about 4 weeks (around 05/08/2015) for Depression.   LABORATORY TESTING:  - Pap smear: up to date  IMMUNIZATIONS:   - Tdap: Tetanus vaccination status reviewed: last tetanus booster within 10 years. - Influenza: Refused - Pneumovax: Refused  SCREENING: -Mammogram: Ordered today    PATIENT COUNSELING:   Advised to take 1 mg of folate supplement per day if capable of pregnancy.   Sexuality: Discussed sexually transmitted diseases, partner selection, use of condoms, avoidance of unintended pregnancy  and contraceptive alternatives.   Advised to avoid cigarette smoking.  I discussed with the patient that most people either abstain from alcohol or drink within safe limits (<=14/week and <=4 drinks/occasion for males, <=7/weeks and <= 3 drinks/occasion for females) and that the risk for alcohol disorders and other health effects rises proportionally with the number of drinks per week and how often a drinker exceeds daily limits.  Discussed cessation/primary prevention of drug use and  availability of treatment for abuse.   Diet: Encouraged to adjust caloric intake to maintain  or achieve ideal body weight, to reduce intake of dietary saturated fat and total fat, to limit sodium intake by avoiding high sodium foods and not adding table salt, and to maintain adequate dietary potassium and calcium preferably from fresh fruits, vegetables, and low-fat dairy products.    stressed the importance of regular exercise  Injury prevention: Discussed safety belts, safety helmets, smoke detector, smoking near bedding or upholstery.   Dental health: Discussed importance of regular tooth brushing, flossing, and dental visits.    NEXT PREVENTATIVE PHYSICAL DUE IN 1 YEAR. Return in about 4 weeks (around 05/08/2015) for Depression.

## 2015-04-10 NOTE — Assessment & Plan Note (Signed)
Significant and irritating. Has tried anosol, has tried tucks, has had them for many years since her first child. Referral to general surgery for evaluation and possible surgery.

## 2015-04-10 NOTE — Assessment & Plan Note (Signed)
Severe and irritating to patient. Referral to urogyn made today. Await referral. Timed voidings to decrease volume in bladder. Continue poise pads. Continue kegals.

## 2015-04-10 NOTE — Assessment & Plan Note (Signed)
Did well on prozac previously. Rx sent to her pharmacy. 1/2 tab x 1 week, then increase to whole tab. Check back in in 1 month.

## 2015-04-24 ENCOUNTER — Ambulatory Visit
Admission: RE | Admit: 2015-04-24 | Discharge: 2015-04-24 | Disposition: A | Discharge status: Home / Self Care | Payer: Medicaid Other | Source: Ambulatory Visit | Attending: Family Medicine | Admitting: Family Medicine

## 2015-04-24 ENCOUNTER — Ambulatory Visit: Payer: Self-pay

## 2015-04-24 ENCOUNTER — Ambulatory Visit (INDEPENDENT_AMBULATORY_CARE_PROVIDER_SITE_OTHER): Payer: Medicaid Other | Admitting: General Surgery

## 2015-04-24 ENCOUNTER — Encounter: Payer: Self-pay | Admitting: General Surgery

## 2015-04-24 VITALS — BP 124/76 | HR 74 | Resp 14 | Ht 66.0 in | Wt 247.0 lb

## 2015-04-24 DIAGNOSIS — Z1231 Encounter for screening mammogram for malignant neoplasm of breast: Secondary | ICD-10-CM | POA: Insufficient documentation

## 2015-04-24 DIAGNOSIS — K649 Unspecified hemorrhoids: Secondary | ICD-10-CM

## 2015-04-24 DIAGNOSIS — Z1239 Encounter for other screening for malignant neoplasm of breast: Secondary | ICD-10-CM

## 2015-04-24 NOTE — Patient Instructions (Signed)
Recommend she try a fiber supplement the week prior to her menstrual period when she is the most constipated. May use glycerin suppositories if needed. She will call back with a status report in a couple of months.

## 2015-04-24 NOTE — Progress Notes (Signed)
Patient ID: Courtney Buchanan, female   DOB: [birth year], 49 y.o.   MRN: [id number]  Chief Complaint  Patient presents with  . Hemorrhoids    HPI Courtney Buchanan is a 49 y.o. female here for evaluation of hemorrhoids. She first noticed the hemorrhoids 1997. She states they flare up on Foley, usually the week prior to her menses. She reports normal bowel movements the rest of the month, but in the days leading up to her. She lines it difficult to defecate. She will spend 20 or 30 minutes sitting on the commode and straining. It is during these episodes that she is most likely to experience bleeding. especially around her period. She has swelling and pain, only occasional bleeding with a BM. Bowels move 3-4 times a day. Constipation 2-3 days prior to period. The hemorrhoids have gotten worse over the years. She has tried preparation H and cream. She does use wet wipes as well. Occasional urinary incontinence with coughing.  I personally reviewed the patient's history.  HPI  Past Medical History  Diagnosis Date  . Skin cancer 2000    melanoma  . Gestational diabetes   . Anxiety     Past Surgical History  Procedure Laterality Date  . Skin cancer excision    . Knee surgery  1987    Right  . Tubal ligation      Family History  Problem Relation Age of Onset  . Diabetes Mother   . Aneurysm Father     Brain  . ADD / ADHD Son   . Heart Problems Son   . Autism Son   . Diabetes Maternal Aunt   . Diabetes Maternal Grandmother   . ADD / ADHD Son     Social History Social History  Substance Use Topics  . Smoking status: Current Every Day Smoker -- 0.50 packs/day for 30 years    Types: Cigarettes  . Smokeless tobacco: Never Used  . Alcohol Use: No     Comment: Rare    Allergies  Allergen Reactions  . Ampicillin Anaphylaxis and Rash  . Penicillins Swelling    Hives and throat constricts    Current Outpatient Prescriptions  Medication Sig Dispense Refill  . esomeprazole  (NEXIUM) 20 MG capsule Take 20 mg by mouth daily at 12 noon.    Marland Kitchen FLUoxetine (PROZAC) 20 MG capsule Take 1 capsule (20 mg total) by mouth daily. (Patient not taking: Reported on 04/24/2015) 30 capsule 1   No current facility-administered medications for this visit.    Review of Systems Review of Systems  Constitutional: Negative.   Respiratory: Negative.   Cardiovascular: Negative.   Gastrointestinal: Positive for constipation, anal bleeding and rectal pain. Negative for nausea, vomiting, abdominal pain and abdominal distention.    Blood pressure 124/76, pulse 74, resp. rate 14, height 5\' 6"  (1.676 m), weight 247 lb (112.038 kg), last menstrual period 04/14/2015.  Physical Exam Physical Exam  Constitutional: She is oriented to person, place, and time. She appears well-developed and well-nourished.  HENT:  Mouth/Throat: Oropharynx is clear and moist.  Eyes: Conjunctivae are normal. No scleral icterus.  Neck: Neck supple.  Cardiovascular: Normal rate, regular rhythm and normal heart sounds.   Pulmonary/Chest: Effort normal and breath sounds normal.  Abdominal: Soft. Normal appearance and bowel sounds are normal. There is no tenderness.  Genitourinary:  Somewhat patulous opening of the anus, but normal anal sphincter tone. No rectal masses. No stool for Hemoccult. No focal tenderness. No evidence of fissure or fistula.  Anoscopy  showed mildly prominent internal hemorrhoids less than 1 cm in diameter. No active bleeding. Visualized lower rectal mucosa was normal. No external hemorrhoids.  No significant perineal descent or rectal mucosal prolapse during vigorous straining.  Lymphadenopathy:    She has no cervical adenopathy.  Neurological: She is alert and oriented to person, place, and time.  Skin: Skin is warm and dry.  Psychiatric: Her behavior is normal.    Data Reviewed PCP notes.  Assessment    Intermittent hemorrhoids aggravated by constipation and prolonged sitting on  the commode.    Plan        Recommend she try a fiber supplement the week prior to her menstrual period when she is the most constipated. May use glycerin suppositories if needed. She will call back with a status report in a couple of months.   PCP/Ref: Courtney Buchanan This information has been scribed by Courtney Buchanan RNBC.  Courtney Buchanan 04/25/2015, 11:55 AM

## 2015-04-25 DIAGNOSIS — K649 Unspecified hemorrhoids: Secondary | ICD-10-CM | POA: Insufficient documentation

## 2015-05-08 ENCOUNTER — Encounter: Payer: Self-pay | Admitting: Family Medicine

## 2015-05-08 ENCOUNTER — Ambulatory Visit (INDEPENDENT_AMBULATORY_CARE_PROVIDER_SITE_OTHER): Payer: Medicaid Other | Admitting: Family Medicine

## 2015-05-08 VITALS — BP 122/78 | HR 76 | Temp 98.2°F | Ht 65.0 in | Wt 246.0 lb

## 2015-05-08 DIAGNOSIS — F332 Major depressive disorder, recurrent severe without psychotic features: Secondary | ICD-10-CM

## 2015-05-08 MED ORDER — FLUOXETINE HCL 20 MG PO CAPS: 20.0000 mg | ORAL_CAPSULE | Freq: Every day | ORAL | Status: AC

## 2015-05-08 NOTE — Assessment & Plan Note (Signed)
Doing much better. Doesn't want to change dose right now. Continue current regimen. Continue to monitor. Recheck in 6 months. Call with any concerns.

## 2015-05-08 NOTE — Progress Notes (Signed)
BP 122/78 mmHg  Pulse 76  Temp(Src) 98.2 F (36.8 C)  Ht 5\' 5"  (1.651 m)  Wt 246 lb (111.585 kg)  BMI 40.94 kg/m2  SpO2 98%  LMP 04/14/2015   Subjective:    Patient ID: Courtney Buchanan, female    DOB: [birth year], 50 y.o.   MRN: [id number]  HPI: Courtney Buchanan is a 50 y.o. female  Chief Complaint  Patient presents with  . Depression   DEPRESSION Mood status: better Satisfied with current treatment?: yes Symptom severity: mild  Duration of current treatment : 1 month Side effects: no Medication compliance: excellent compliance Psychotherapy/counseling: no  Depressed mood: yes Anxious mood: no Anhedonia: no Significant weight loss or gain: no Insomnia: no  Fatigue: no Feelings of worthlessness or guilt: no Impaired concentration/indecisiveness: no Suicidal ideations: no Hopelessness: no Crying spells: no Depression screen [facility] 2/9 05/08/2015 04/10/2015  Decreased Interest 1 1  Down, Depressed, Hopeless 1 2  PHQ - 2 Score 2 3  Altered sleeping 1 3  Tired, decreased energy 0 2  Change in appetite 0 1  Feeling bad or failure about yourself  1 3  Trouble concentrating 1 0  Moving slowly or fidgety/restless 2 1  Suicidal thoughts 0 2  PHQ-9 Score 7 15  Difficult doing work/chores Somewhat difficult Very difficult    Relevant past medical, surgical, family and social history reviewed and updated as indicated. Interim medical history since our last visit reviewed. Allergies and medications reviewed and updated.  Review of Systems  Constitutional: Negative.   Respiratory: Negative.   Cardiovascular: Negative.   Gastrointestinal: Negative.   Psychiatric/Behavioral: Negative.     Per HPI unless specifically indicated above     Objective:    BP 122/78 mmHg  Pulse 76  Temp(Src) 98.2 F (36.8 C)  Ht 5\' 5"  (1.651 m)  Wt 246 lb (111.585 kg)  BMI 40.94 kg/m2  SpO2 98%  LMP 04/14/2015  Wt Readings from Last 3 Encounters:  05/08/15 246 lb (111.585 kg)   04/24/15 247 lb (112.038 kg)  04/10/15 246 lb (111.585 kg)    Physical Exam  Constitutional: She is oriented to person, place, and time. She appears well-developed and well-nourished. No distress.  HENT:  Head: Normocephalic and atraumatic.  Right Ear: Hearing and external ear normal.  Left Ear: Hearing and external ear normal.  Nose: Nose normal.  Mouth/Throat: Oropharynx is clear and moist. No oropharyngeal exudate.  Eyes: Conjunctivae, EOM and lids are normal. Pupils are equal, round, and reactive to light. Right eye exhibits no discharge. Left eye exhibits no discharge. No scleral icterus.  Cardiovascular: Normal rate, regular rhythm, normal heart sounds and intact distal pulses.  Exam reveals no gallop and no friction rub.   No murmur heard. Pulmonary/Chest: Effort normal and breath sounds normal. No respiratory distress. She has no wheezes. She has no rales. She exhibits no tenderness.  Musculoskeletal: Normal range of motion.  Neurological: She is alert and oriented to person, place, and time.  Skin: Skin is warm, dry and intact. No rash noted. No erythema. No pallor.  Psychiatric: She has a normal mood and affect. Her speech is normal and behavior is normal. Judgment and thought content normal. Cognition and memory are normal.  Nursing note and vitals reviewed.   Results for orders placed or performed in visit on 03/20/15  GC/Chlamydia Probe Amp  Result Value Ref Range   Chlamydia trachomatis, NAA Negative Negative   Neisseria gonorrhoeae by PCR Negative Negative  Microscopic Examination  Result Value  Ref Range   WBC, UA None seen 0 -  5 /hpf   RBC, UA 0-2 0 -  2 /hpf   Epithelial Cells (non renal) 0-10 0 - 10 /hpf   Bacteria, UA Few None seen/Few  HIV antibody  Result Value Ref Range   HIV Screen 4th Generation wRfx Non Reactive Non Reactive  CBC with Differential/Platelet  Result Value Ref Range   WBC 6.5 3.4 - 10.8 x10E3/uL   RBC 4.10 3.77 - 5.28 x10E6/uL    Hemoglobin 12.2 11.1 - 15.9 g/dL   Hematocrit 37.6 34.0 - 46.6 %   MCV 92 79 - 97 fL   MCH 29.8 26.6 - 33.0 pg   MCHC 32.4 31.5 - 35.7 g/dL   RDW 15.4 12.3 - 15.4 %   Platelets 284 150 - 379 x10E3/uL   Neutrophils 55 %   Lymphs 34 %   Monocytes 7 %   Eos 3 %   Basos 1 %   Neutrophils Absolute 3.6 1.4 - 7.0 x10E3/uL   Lymphocytes Absolute 2.2 0.7 - 3.1 x10E3/uL   Monocytes Absolute 0.4 0.1 - 0.9 x10E3/uL   EOS (ABSOLUTE) 0.2 0.0 - 0.4 x10E3/uL   Basophils Absolute 0.0 0.0 - 0.2 x10E3/uL   Immature Granulocytes 0 %   Immature Grans (Abs) 0.0 0.0 - 0.1 x10E3/uL  Lipid Panel w/o Chol/HDL Ratio  Result Value Ref Range   Cholesterol, Total 202 (H) 100 - 199 mg/dL   Triglycerides 76 0 - 149 mg/dL   HDL 51 >39 mg/dL   VLDL Cholesterol Cal 15 5 - 40 mg/dL   LDL Calculated 136 (H) 0 - 99 mg/dL  TSH  Result Value Ref Range   TSH 0.909 0.450 - 4.500 uIU/mL  UA/M w/rflx Culture, Routine  Result Value Ref Range   Specific Gravity, UA 1.010 1.005 - 1.030   pH, UA 5.0 5.0 - 7.5   Color, UA Yellow Yellow   Appearance Ur Clear Clear   Leukocytes, UA Negative Negative   Protein, UA Negative Negative/Trace   Glucose, UA Negative Negative   Ketones, UA Negative Negative   RBC, UA 1+ (A) Negative   Bilirubin, UA Negative Negative   Urobilinogen, Ur 0.2 0.2 - 1.0 mg/dL   Nitrite, UA Negative Negative   Microscopic Examination See below:   Comprehensive metabolic panel  Result Value Ref Range   Glucose 89 65 - 99 mg/dL   BUN 11 6 - 24 mg/dL   Creatinine, Ser 0.72 0.57 - 1.00 mg/dL   GFR calc non Af Amer 100 >59 mL/min/1.73   GFR calc Af Amer 115 >59 mL/min/1.73   BUN/Creatinine Ratio 15 9 - 23   Sodium 140 136 - 144 mmol/L   Potassium 4.9 3.5 - 5.2 mmol/L   Chloride 102 97 - 106 mmol/L   CO2 23 18 - 29 mmol/L   Calcium 9.0 8.7 - 10.2 mg/dL   Total Protein 6.5 6.0 - 8.5 g/dL   Albumin 4.0 3.5 - 5.5 g/dL   Globulin, Total 2.5 1.5 - 4.5 g/dL   Albumin/Globulin Ratio 1.6 1.1 - 2.5    Bilirubin Total <0.2 0.0 - 1.2 mg/dL   Alkaline Phosphatase 49 39 - 117 IU/L   AST 13 0 - 40 IU/L   ALT 10 0 - 32 IU/L  Hepatitis, Acute  Result Value Ref Range   Hep A IgM Negative Negative   Hepatitis B Surface Ag Negative Negative   Hep B C IgM Negative Negative   Hep  C Virus Ab <0.1 0.0 - 0.9 s/co ratio  RPR  Result Value Ref Range   RPR Ser Ql Non Reactive Non Reactive  HSV(herpes simplex vrs) 1+2 ab-IgG  Result Value Ref Range   HSV 1 Glycoprotein G Ab, IgG <0.91 0.00 - 0.90 index   HSV 2 Glycoprotein G Ab, IgG 8.26 (H) 0.00 - 0.90 index      Assessment & Plan:   Problem List Items Addressed This Visit      Other   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 Doesn't want to change dose right now. Continue current regimen. Continue to monitor. Recheck in 6 months. Call with any concerns.       Relevant Medications   FLUoxetine (PROZAC) 20 MG capsule       Follow up plan: Return in about 6 months (around 11/05/2015) for Follow up.

## 2015-10-24 ENCOUNTER — Telehealth: Payer: Self-pay | Admitting: Family Medicine

## 2015-10-24 NOTE — Telephone Encounter (Signed)
Pt called and stated that she is going through a rough time and would like to speak with PCP about possibly increasing meds. I advised pt that when dosage is changed often appointments are required but it is up to the provider.

## 2015-10-24 NOTE — Telephone Encounter (Signed)
Called to discuss this with patient, no answer, left message for patient to return my call.

## 2015-10-27 NOTE — Telephone Encounter (Signed)
Called patient, no answer, left message. Will try again.  

## 2015-10-28 NOTE — Telephone Encounter (Signed)
Called, no answer, patient is scheduled to be seen on 11/05/15, will wait to discuss this with her at her visit.

## 2015-11-05 ENCOUNTER — Ambulatory Visit: Payer: Self-pay | Admitting: Family Medicine

## 2019-05-17 ENCOUNTER — Ambulatory Visit: Payer: Medicaid Other | Attending: Internal Medicine

## 2019-05-17 DIAGNOSIS — Z20822 Contact with and (suspected) exposure to covid-19: Secondary | ICD-10-CM

## 2019-05-18 LAB — NOVEL CORONAVIRUS, NAA: SARS-CoV-2, NAA: NOT DETECTED

## 2022-09-22 LAB — EXTERNAL GENERIC LAB PROCEDURE: COLOGUARD: NEGATIVE

## 2023-05-02 ENCOUNTER — Ambulatory Visit
Admission: EM | Admit: 2023-05-02 | Discharge: 2023-05-02 | Disposition: A | Discharge status: Home / Self Care | Payer: Medicaid Other | Attending: Emergency Medicine | Admitting: Emergency Medicine

## 2023-05-02 DIAGNOSIS — J101 Influenza due to other identified influenza virus with other respiratory manifestations: Secondary | ICD-10-CM | POA: Insufficient documentation

## 2023-05-02 DIAGNOSIS — R051 Acute cough: Secondary | ICD-10-CM | POA: Insufficient documentation

## 2023-05-02 LAB — RESP PANEL BY RT-PCR (FLU A&B, COVID) ARPGX2
Influenza A by PCR: POSITIVE — AB
Influenza B by PCR: NEGATIVE
SARS Coronavirus 2 by RT PCR: NEGATIVE

## 2023-05-02 MED ORDER — FLUTICASONE PROPIONATE 50 MCG/ACT NA SUSP: 2.0000 | Freq: Every day | NASAL | 0 refills | Status: AC

## 2023-05-02 MED ORDER — PROMETHAZINE-DM 6.25-15 MG/5ML PO SYRP: 5.0000 mL | ORAL_SOLUTION | Freq: Four times a day (QID) | ORAL | 0 refills | Status: AC | PRN

## 2023-05-02 NOTE — ED Provider Notes (Incomplete)
HPI  SUBJECTIVE:  Courtney Buchanan is a 57 y.o. female who presents with ***    Past Medical History:  Diagnosis Date   Anxiety    Depression    Gestational diabetes    Skin cancer 2000   melanoma    Past Surgical History:  Procedure Laterality Date   KNEE SURGERY  1987   Right   SKIN CANCER EXCISION     TUBAL LIGATION      Family History  Problem Relation Age of Onset   Diabetes Mother    Aneurysm Father        Brain   ADD / ADHD Son    Heart Problems Son    Autism Son    Diabetes Maternal Aunt    Diabetes Maternal Grandmother    ADD / ADHD Son     Social History   Tobacco Use   Smoking status: Every Day    Current packs/day: 0.50    Average packs/day: 0.5 packs/day for 30.0 years (15.0 ttl pk-yrs)    Types: Cigarettes   Smokeless tobacco: Never  Substance Use Topics   Alcohol use: No    Alcohol/week: 0.0 standard drinks of alcohol    Comment: Rare   Drug use: Yes    Types: Marijuana    No current facility-administered medications for this encounter.  Current Outpatient Medications:    fluticasone (FLONASE) 50 MCG/ACT nasal spray, Place 2 sprays into both nostrils daily., Disp: 16 g, Rfl: 0   promethazine-dextromethorphan (PROMETHAZINE-DM) 6.25-15 MG/5ML syrup, Take 5 mLs by mouth 4 (four) times daily as needed for cough., Disp: 118 mL, Rfl: 0   esomeprazole (NEXIUM) 20 MG capsule, Take 20 mg by mouth daily at 12 noon., Disp: , Rfl:    FLUoxetine (PROZAC) 20 MG capsule, Take 1 capsule (20 mg total) by mouth daily., Disp: 30 capsule, Rfl: 6  Allergies  Allergen Reactions   Ampicillin Anaphylaxis and Rash   Penicillins Swelling    Hives and throat constricts     ROS  As noted in HPI.   Physical Exam  BP 113/79 (BP Location: Left Arm)   Pulse 70   Temp 98.6 F (37 C) (Oral)   Resp 19   LMP 04/14/2015   SpO2 94%  *** Constitutional: Well developed, well nourished, no acute distress Eyes:  EOMI, conjunctiva normal bilaterally HENT:  Normocephalic, atraumatic,mucus membranes moist Respiratory: Normal inspiratory effort Cardiovascular: Normal rate GI: nondistended skin: No rash, skin intact Musculoskeletal: no deformities Neurologic: Alert & oriented x 3, no focal neuro deficits Psychiatric: Speech and behavior appropriate   ED Course   Medications - No data to display  Orders Placed This Encounter  Procedures   Resp Panel by RT-PCR (Flu A&B, Covid) Anterior Nasal Swab    Standing Status:   Standing    Number of Occurrences:   1    Patient immune status:   Normal    Release to patient:   Immediate [1]    Results for orders placed or performed during the hospital encounter of 05/02/23 (from the past 24 hours)  Resp Panel by RT-PCR (Flu A&B, Covid) Anterior Nasal Swab     Status: Abnormal   Collection Time: 05/02/23  2:42 PM   Specimen: Anterior Nasal Swab  Result Value Ref Range   SARS Coronavirus 2 by RT PCR NEGATIVE NEGATIVE   Influenza A by PCR POSITIVE (A) NEGATIVE   Influenza B by PCR NEGATIVE NEGATIVE   No results found.  ED Clinical Impression  1. Influenza A   2. Acute cough      ED Assessment/Plan   {The patient has not been seen in Urgent Care in the last 3 years. :1} Influenza A positive.  Unfortunately, she is out of the treatment window for Tamiflu, but is getting better.  Supportive treatment.  Promethazine DM, saline nasal rogation, Flonase, continue DayQuil/NyQuil.  Will write a work note clearing her to return to work.  Follow-up with PCP as needed  Discussed labs, MDM, treatment plan, and plan for follow-up with {Blank single:19197::"family","parent","patient"}. . {Blank single:19197::"family","parent","patient"} agrees with plan.   Meds ordered this encounter  Medications   fluticasone (FLONASE) 50 MCG/ACT nasal spray    Sig: Place 2 sprays into both nostrils daily.    Dispense:  16 g    Refill:  0   promethazine-dextromethorphan (PROMETHAZINE-DM) 6.25-15 MG/5ML syrup     Sig: Take 5 mLs by mouth 4 (four) times daily as needed for cough.    Dispense:  118 mL    Refill:  0      *This clinic note was created using Scientist, clinical (histocompatibility and immunogenetics). Therefore, there may be occasional mistakes despite careful proofreading.  ?

## 2023-05-02 NOTE — ED Triage Notes (Signed)
Sx x 3 days  Cough  Fever Sneezing Wants to be cleared to go back to work. Feels better except cough.

## 2023-05-02 NOTE — Discharge Instructions (Addendum)
Promethazine DM, saline nasal irrigation with a NeilMed sinus rinse and distilled water as often as you want to prevent a bacterial sinus infection, Flonase, continue DayQuil/NyQuil.  You may return to work tomorrow.  You do not need to wear a mask.
